# Patient Record
Sex: Male | Born: 1995 | Race: Black or African American | Hispanic: No | Marital: Single
Health system: Southern US, Community
[De-identification: ages and names within clinical notes are randomized; demographics above are authoritative.]

## PROBLEM LIST (undated history)

## (undated) DIAGNOSIS — J302 Other seasonal allergic rhinitis: Secondary | ICD-10-CM

---
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
## 2016-02-23 IMAGING — CT CT FEMUR *R* W/ CM
2 of 6 series · 6 of 34 positions shown, 8 images · IV contrast (agent unspecified)
Comparison: None.

CONTRAST:  80mL OMNIPAQUE IOHEXOL 300 MG/ML  SOLN

CLINICAL DATA: Patient was kneed in the back of the right femur
last week while playing football. Pain in the right femur since
then. Swelling.

EXAM:
CT OF THE LOWER RIGHT EXTREMITY WITH CONTRAST
TECHNIQUE: Multidetector CT imaging of the lower right extremities was
performed according to the standard protocol following intravenous
contrast administration.

[Series 302: soft tissue · axial · 0.30mm/px · z∈[-182,+170]mm · 5 of 254 slices shown, 7 images]
[im 39/254  soft-tissue]
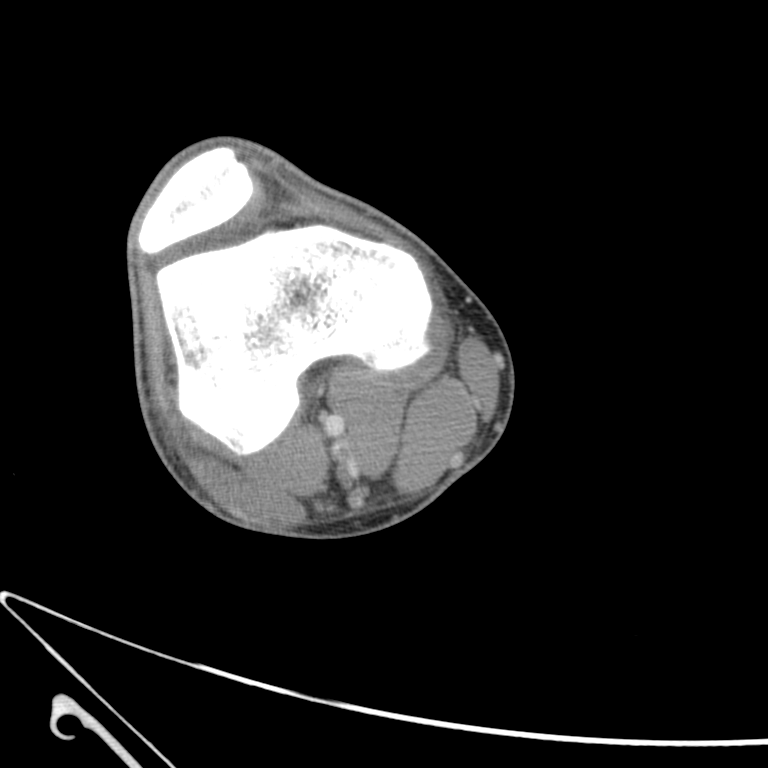
[im 39/254  bone]
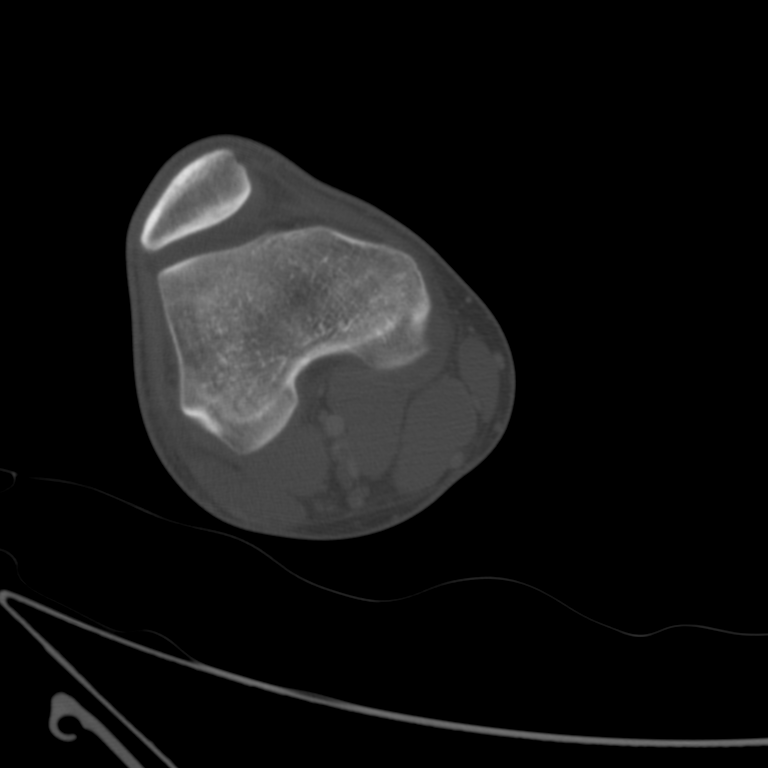
[im 78/254  bone]
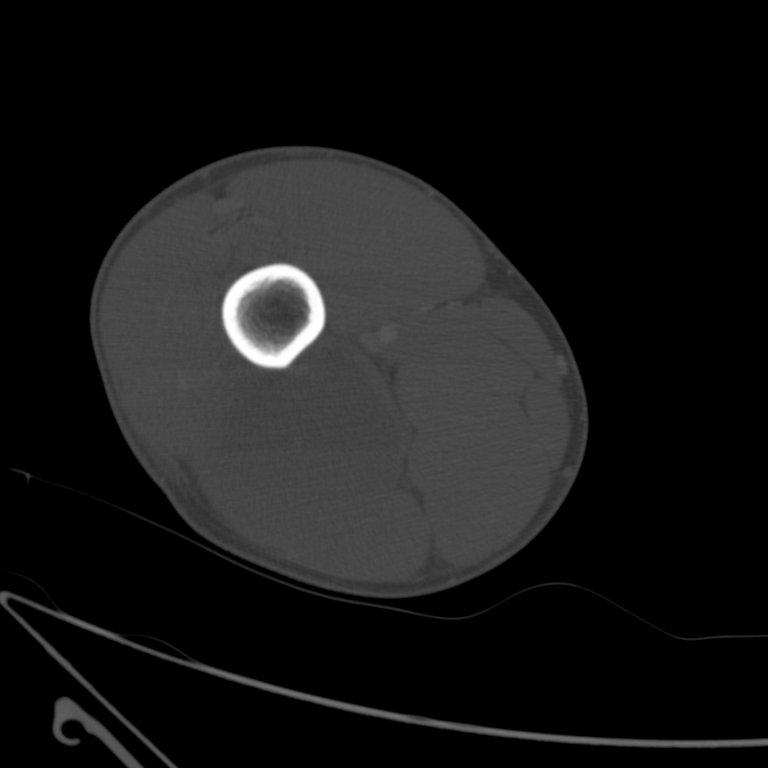
[im 137/254  bone]
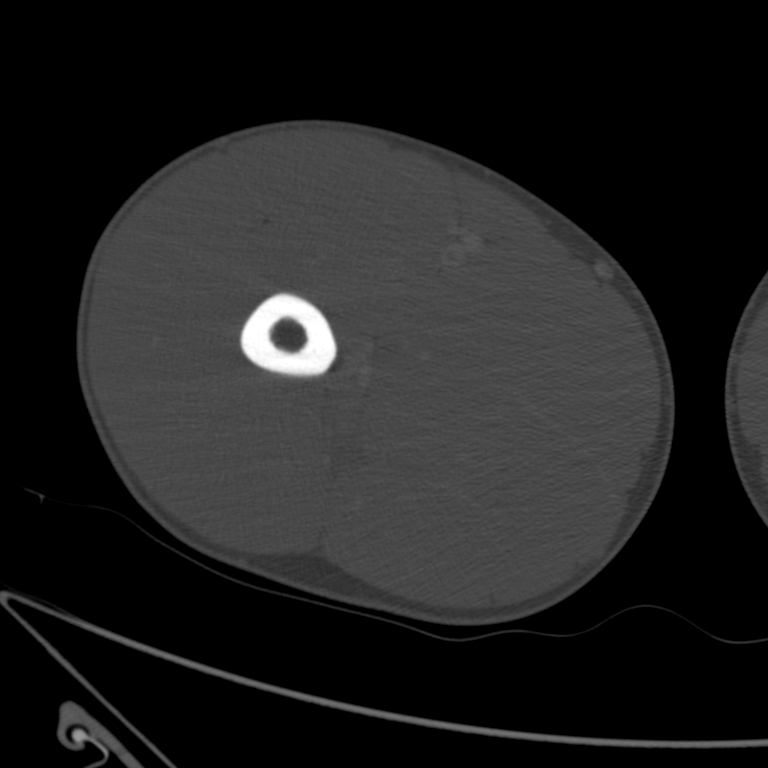
[im 176/254  bone]
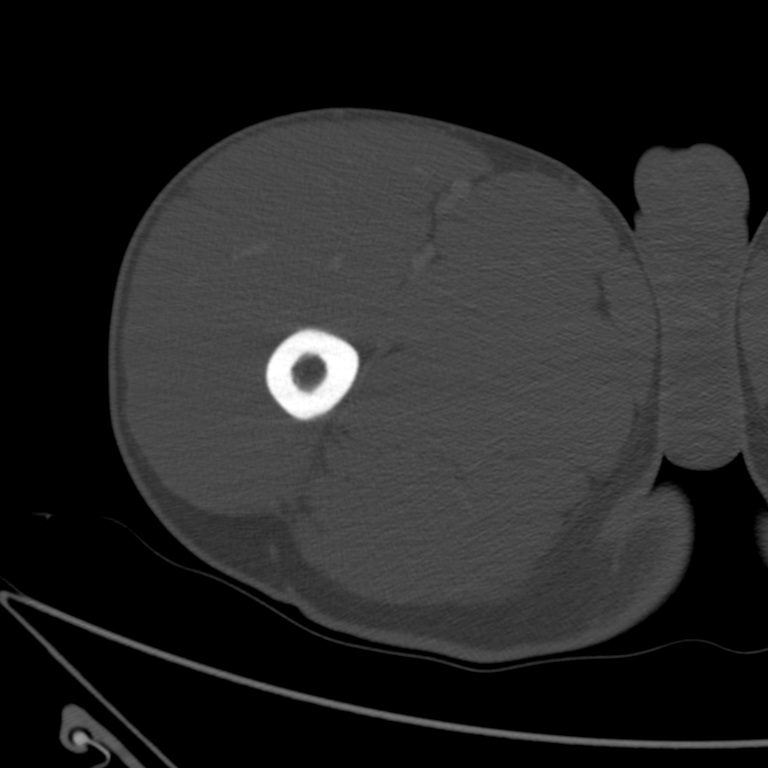
[im 215/254  soft-tissue]
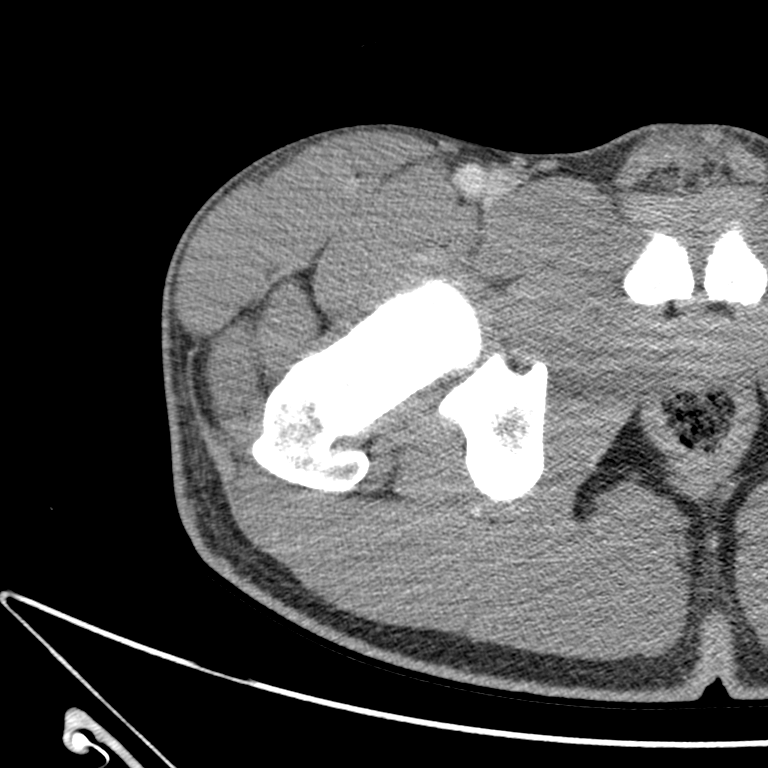
[im 215/254  bone]
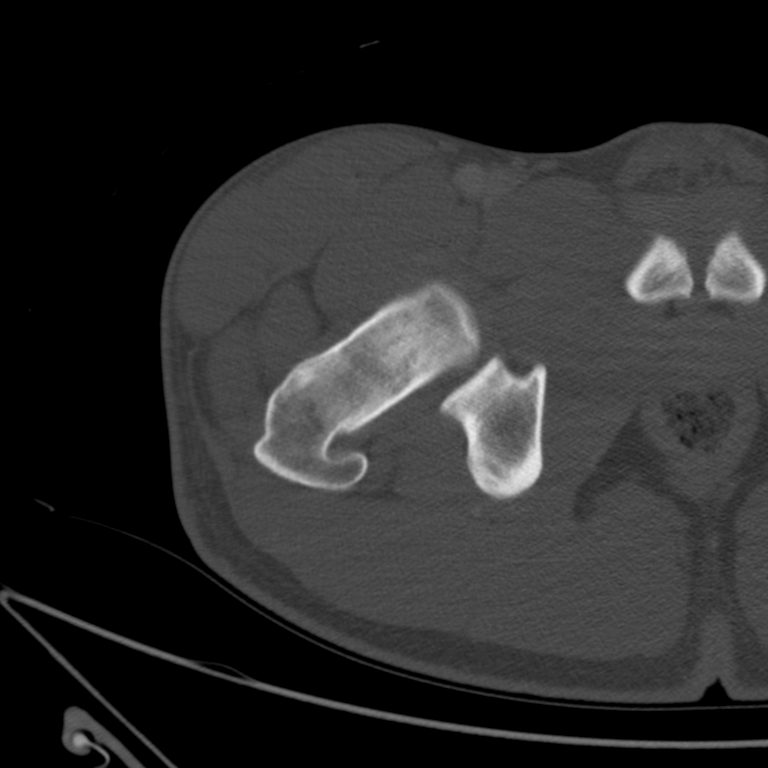

[Series 303: coronals · coronal · 0.31mm/px · 1 of 116 slices shown]
[im 58/116  bone]
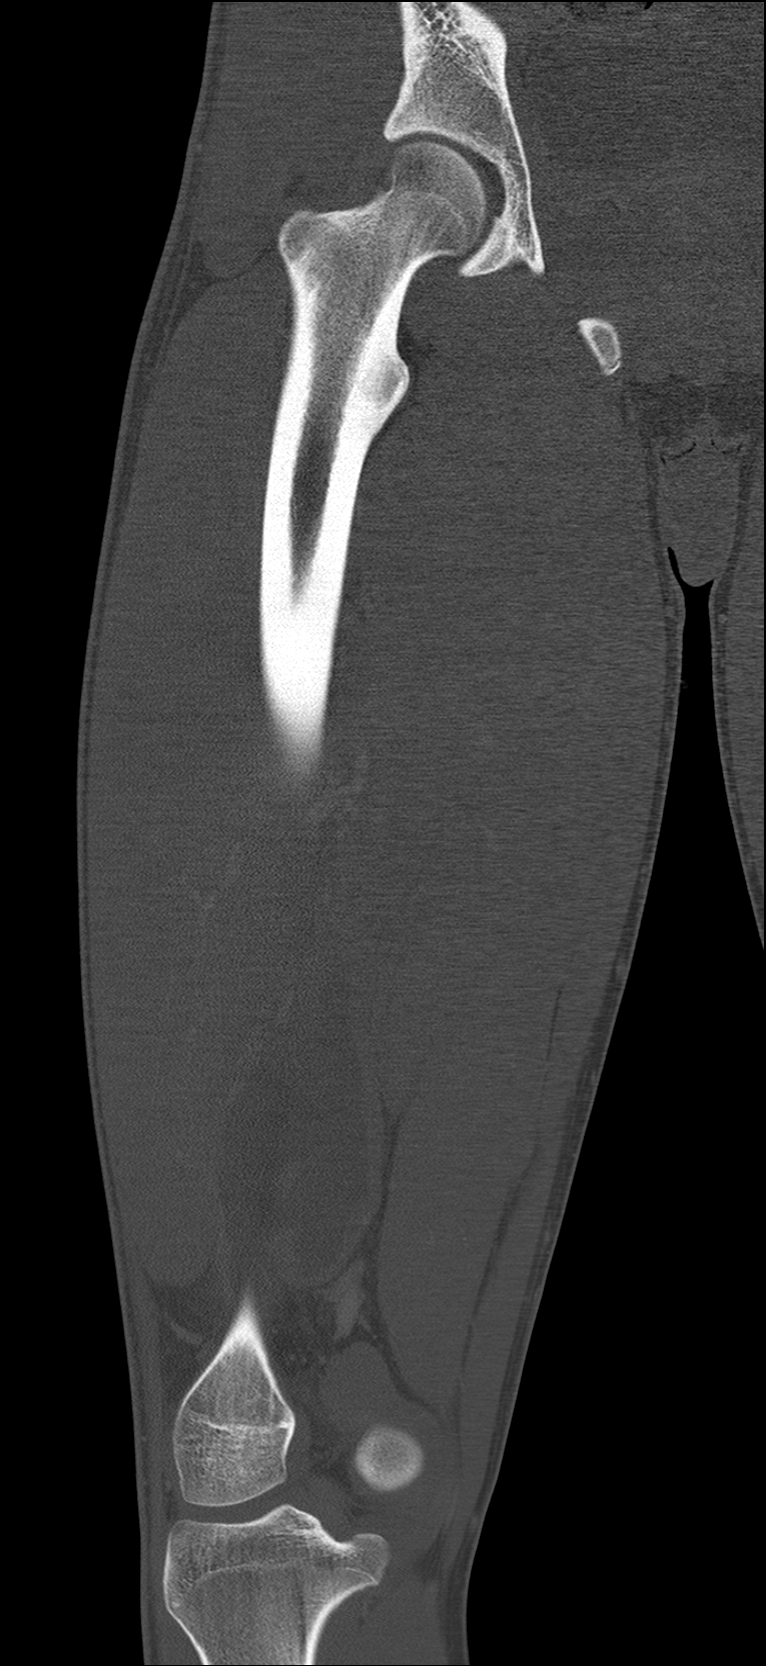

[6 of 34 positions shown; findings below may reference images not displayed]

FINDINGS: Right hip, right femur, and right knee appear intact. No acute
fracture or dislocation. There is a large hematoma with fluid fluid
level demonstrated in the posterior compartment of the right thigh
beginning at the level of the knee and extending proximally to the
level of the midshaft. Hematoma measures 5 x 5.9 by 12.4 cm.
Visualized common femoral, superficial femoral, and popliteal artery
and vein appear patent.
IMPRESSION: Large hematoma in the posterior compartment of the right thigh
laterally. Right femur appears intact. No displaced fractures
identified.

## 2019-11-26 ENCOUNTER — Ambulatory Visit: Payer: Self-pay

## 2019-12-24 ENCOUNTER — Ambulatory Visit: Payer: Self-pay | Attending: Family

## 2019-12-24 DIAGNOSIS — Z23 Encounter for immunization: Secondary | ICD-10-CM

## 2019-12-24 NOTE — Progress Notes (Signed)
   Covid-19 Vaccination Clinic  Name:  Gabriel Schneider    MRN: [id number] DOB: 1995/10/25  12/24/2019  [name] was observed post Covid-19 immunization for 15 minutes without incident. He was provided with Vaccine Information Sheet and instruction to access the V-Safe system.   [name] was instructed to call 911 with any severe reactions post vaccine: Marland Kitchen Difficulty breathing  . Swelling of face and throat  . A fast heartbeat  . A bad rash all over body  . Dizziness and weakness   Immunizations Administered    Name Date Dose VIS Date Route   Moderna COVID-19 Vaccine 12/24/2019  2:58 PM 0.5 mL 08/11/2019 Intramuscular   Manufacturer: Moderna   Lot: [id number]   NDC: 52479-980-01

## 2020-01-26 ENCOUNTER — Ambulatory Visit: Payer: Self-pay | Attending: Family

## 2020-01-26 DIAGNOSIS — Z23 Encounter for immunization: Secondary | ICD-10-CM

## 2020-01-26 NOTE — Progress Notes (Signed)
   Covid-19 Vaccination Clinic  Name:  Gabriel Schneider    MRN: [id number] DOB: [birth year]  01/26/2020  Gabriel Schneider was observed post Covid-19 immunization for 15 minutes without incident. He was provided with Vaccine Information Sheet and instruction to access the V-Safe system.   Gabriel Schneider was instructed to call 911 with any severe reactions post vaccine: Marland Kitchen Difficulty breathing  . Swelling of face and throat  . A fast heartbeat  . A bad rash all over body  . Dizziness and weakness   Immunizations Administered    Name Date Dose VIS Date Route   Moderna COVID-19 Vaccine 01/26/2020  1:46 PM 0.5 mL 08/2019 Intramuscular   Manufacturer: Moderna   Lot: [id number]   NDC: 46950-722-57

## 2023-10-07 ENCOUNTER — Ambulatory Visit: Payer: Self-pay | Admitting: Family Medicine

## 2023-10-07 DIAGNOSIS — Z113 Encounter for screening for infections with a predominantly sexual mode of transmission: Secondary | ICD-10-CM

## 2023-10-07 DIAGNOSIS — Z202 Contact with and (suspected) exposure to infections with a predominantly sexual mode of transmission: Secondary | ICD-10-CM

## 2023-10-07 LAB — HM HIV SCREENING LAB: HM HIV Screening: NEGATIVE

## 2023-10-07 MED ORDER — DOXYCYCLINE HYCLATE 100 MG PO TABS: 100.0000 mg | ORAL_TABLET | Freq: Two times a day (BID) | ORAL | Status: AC

## 2023-10-07 NOTE — Progress Notes (Signed)
[facility] Department STI clinic [address] [place] [address] Main phone: [phone]  STI screening visit  Subjective:  Gabriel Schneider is a 28 y.o. male being seen today for an STI screening visit. The patient reports they do not have symptoms.    Patient has the following medical conditions:  There are no active problems to display for this patient.   Chief Complaint  Patient presents with   SEXUALLY TRANSMITTED DISEASE    STI screening-no symptoms-contact to chlamydia    HPI HPI Patient reports to clinic as a contact to chlamydia. Reports no symptoms.   Last HIV test per patient/review of record was No results found for: "HMHIVSCREEN" No results found for: "HIV"  Last HEPC test per patient/review of record was No results found for: "HMHEPCSCREEN" No components found for: "HEPC"   Last HEPB test per patient/review of record was No components found for: "HMHEPBSCREEN" No components found for: "HEPC"   Does the patient or their partner desires a pregnancy in the next year? No  Screening for MPX risk: Does the patient have an unexplained rash? No Is the patient MSM? No Does the patient endorse multiple sex partners or anonymous sex partners? No Did the patient have close or sexual contact with a person diagnosed with MPX? No Has the patient traveled outside the [place] where MPX is endemic? No Is there a high clinical suspicion for MPX-- evidenced by one of the following No  -Unlikely to be chickenpox  -Lymphadenopathy  -Rash that present in same phase of evolution on any given body part   See flowsheet for further details and programmatic requirements.   Immunization History  Administered Date(s) Administered   [employer] Vaccination 12/24/2019, 01/26/2020     The following portions of the patient's history were reviewed and updated as appropriate: allergies, current medications, past medical history, past  social history, past surgical history and problem list.  Objective:  There were no vitals filed for this visit.  Physical Exam Vitals and nursing note reviewed.  Constitutional:      Appearance: Normal appearance.  HENT:     Head: Normocephalic and atraumatic.     Mouth/Throat:     Mouth: Mucous membranes are moist.     Pharynx: No oropharyngeal exudate or posterior oropharyngeal erythema.  Eyes:     General:        Right eye: No discharge.        Left eye: No discharge.     Conjunctiva/sclera:     Right eye: Right conjunctiva is not injected. No exudate.    Left eye: Left conjunctiva is not injected. No exudate. Pulmonary:     Effort: Pulmonary effort is normal.  Abdominal:     General: Abdomen is flat.     Palpations: Abdomen is soft. There is no hepatomegaly or mass.     Tenderness: There is no abdominal tenderness. There is no rebound.  Genitourinary:    Comments: Declined genital exam- asymptomatic Lymphadenopathy:     Cervical: No cervical adenopathy.     Upper Body:     Right upper body: No supraclavicular or axillary adenopathy.     Left upper body: No supraclavicular or axillary adenopathy.  Skin:    General: Skin is warm and dry.  Neurological:     Mental Status: He is alert and oriented to person, place, and time.     Assessment and Plan:  Gabriel Schneider is a 28 y.o. male presenting  to the [facility] Department for STI screening  1. Exposure to chlamydia (Primary)  - Chlamydia/GC NAA, Confirmation - doxycycline (VIBRA-TABS) 100 MG tablet; Take 1 tablet (100 mg total) by mouth 2 (two) times daily for 7 days.  2. Screening for venereal disease  - Chlamydia/GC NAA, Confirmation - [facility] - Syphilis Serology, [facility]   Patient does not have STI symptoms Patient accepted all screenings including  urine GC/Chlamydia, and blood work for HIV/Syphilis. Patient meets criteria for HepB screening? No. Ordered? not  applicable Patient meets criteria for HepC screening? No. Ordered? not applicable Recommended condom use with all sex Discussed importance of condom use for STI prevention  Treat positive test results per standing order. Discussed time line for [facility] results and that patient will be called with positive results and encouraged patient to call if he had not heard in 2 weeks Recommended repeat testing in 3 months with positive results. Recommended returning for continued or worsening symptoms.   Return if symptoms worsen or fail to improve, for STI screening.  No future appointments.  Lenice Llamas, [place]

## 2023-10-07 NOTE — Progress Notes (Signed)
Pt here for STI screening and treatment as contact to chlamydia.  No in house labs performed.  Doxycycline 100mg  #14 dispensed to patient.  Pt counseled on medication, side effects, plan of care and when to contact clinic with questions or concerns.  Verbalizes understanding.  Condoms declined.-Collins Scotland, RN

## 2023-10-09 LAB — CHLAMYDIA/GC NAA, CONFIRMATION
Chlamydia trachomatis, NAA: NEGATIVE
Neisseria gonorrhoeae, NAA: NEGATIVE
